# Patient Record
Sex: Male | Born: 1995 | Race: Black or African American | Hispanic: No | Marital: Single | Smoking: Never smoker
Health system: Southern US, Community
[De-identification: ages and names within clinical notes are randomized; demographics above are authoritative.]

## PROBLEM LIST (undated history)

## (undated) HISTORY — PX: KNEE SURGERY: SHX244

---

## 2015-05-18 DIAGNOSIS — J069 Acute upper respiratory infection, unspecified: Secondary | ICD-10-CM | POA: Diagnosis not present

## 2015-08-07 ENCOUNTER — Other Ambulatory Visit: Payer: Self-pay | Admitting: Family Medicine

## 2015-08-07 DIAGNOSIS — T1490XA Injury, unspecified, initial encounter: Secondary | ICD-10-CM

## 2015-08-07 DIAGNOSIS — M25562 Pain in left knee: Secondary | ICD-10-CM

## 2015-08-10 ENCOUNTER — Ambulatory Visit: Payer: Managed Care, Other (non HMO)

## 2015-08-11 ENCOUNTER — Ambulatory Visit
Admission: RE | Admit: 2015-08-11 | Discharge: 2015-08-11 | Disposition: A | Discharge status: Home / Self Care | Payer: Managed Care, Other (non HMO) | Source: Ambulatory Visit | Attending: Family Medicine | Admitting: Family Medicine

## 2015-08-11 ENCOUNTER — Encounter: Payer: Self-pay | Admitting: Emergency Medicine

## 2015-08-11 DIAGNOSIS — Z5321 Procedure and treatment not carried out due to patient leaving prior to being seen by health care provider: Secondary | ICD-10-CM | POA: Insufficient documentation

## 2015-08-11 DIAGNOSIS — X58XXXA Exposure to other specified factors, initial encounter: Secondary | ICD-10-CM

## 2015-08-11 DIAGNOSIS — M545 Low back pain: Secondary | ICD-10-CM | POA: Diagnosis present

## 2015-08-11 DIAGNOSIS — M25562 Pain in left knee: Secondary | ICD-10-CM

## 2015-08-11 DIAGNOSIS — M25462 Effusion, left knee: Secondary | ICD-10-CM

## 2015-08-11 DIAGNOSIS — S83242A Other tear of medial meniscus, current injury, left knee, initial encounter: Secondary | ICD-10-CM | POA: Insufficient documentation

## 2015-08-11 NOTE — ED Notes (Signed)
Patient ambulatory to triage with steady gait, without difficulty or distress noted; pt reports lower back pain with hx of same; st began increasing after bball season was completed

## 2015-08-12 ENCOUNTER — Emergency Department
Admission: EM | Admit: 2015-08-12 | Discharge: 2015-08-12 | Disposition: A | Discharge status: Home / Self Care | Payer: Managed Care, Other (non HMO) | Attending: Emergency Medicine | Admitting: Emergency Medicine

## 2015-08-12 NOTE — ED Notes (Signed)
Pt not found in flex lobby upon rounding

## 2015-08-28 ENCOUNTER — Ambulatory Visit: Payer: Managed Care, Other (non HMO)

## 2015-09-17 ENCOUNTER — Ambulatory Visit (INDEPENDENT_AMBULATORY_CARE_PROVIDER_SITE_OTHER): Payer: Managed Care, Other (non HMO) | Admitting: Family Medicine

## 2015-09-17 ENCOUNTER — Encounter: Payer: Self-pay | Admitting: Family Medicine

## 2015-09-17 VITALS — BP 122/67 | HR 94 | Temp 97.8°F | Resp 16

## 2015-09-17 DIAGNOSIS — J029 Acute pharyngitis, unspecified: Secondary | ICD-10-CM

## 2015-09-17 MED ORDER — AZITHROMYCIN 250 MG PO TABS: ORAL_TABLET | ORAL | Status: AC

## 2015-09-17 NOTE — Progress Notes (Signed)
Patient ID: Nicholas Duarte, male   DOB: 12/26/1995, 20 y.o.   MRN: [id number]  Patient presents today with symptoms of sore throat, nasal congestion, cough. Patient has had the symptoms for the last few days. He denies any fever or chills. He denies any chest pain, shortness of breath, nausea, vomiting, diarrhea, extreme fatigue, abdominal pain. He has not taken any medications today. He does state that the mucus that he is producing is colored.  ROS: Negative except mentioned above. Vitals as per Epic. GENERAL: NAD HEENT: mild pharyngeal erythema, no exudate, no erythema of TMs, no cervical LAD RESP: CTA B CARD: RRR NEURO: CN II-XII grossly intact   A/P: URI- rapid strep and flu screen were negative, patient given prescription for Z-Pak, Delsym when necessary, Claritin when necessary, Ibuprofen when necessary, rest, hydration, seek medical attention if symptoms persist or worsen. No athletic activity if febrile.

## 2016-04-07 ENCOUNTER — Ambulatory Visit (INDEPENDENT_AMBULATORY_CARE_PROVIDER_SITE_OTHER): Payer: 59 | Admitting: Family Medicine

## 2016-04-07 ENCOUNTER — Other Ambulatory Visit: Payer: Self-pay | Admitting: Family Medicine

## 2016-04-07 ENCOUNTER — Ambulatory Visit
Admission: RE | Admit: 2016-04-07 | Discharge: 2016-04-07 | Disposition: A | Discharge status: Home / Self Care | Payer: 59 | Source: Ambulatory Visit | Attending: Family Medicine | Admitting: Family Medicine

## 2016-04-07 VITALS — BP 116/62 | HR 71 | Temp 97.1°F

## 2016-04-07 DIAGNOSIS — G4489 Other headache syndrome: Secondary | ICD-10-CM

## 2016-04-07 DIAGNOSIS — S060X0D Concussion without loss of consciousness, subsequent encounter: Secondary | ICD-10-CM

## 2016-04-07 MED ORDER — NAPROXEN 500 MG PO TABS: 500.0000 mg | ORAL_TABLET | Freq: Two times a day (BID) | ORAL | 0 refills | Status: DC
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
## 2016-06-10 ENCOUNTER — Ambulatory Visit (INDEPENDENT_AMBULATORY_CARE_PROVIDER_SITE_OTHER): Payer: Managed Care, Other (non HMO) | Admitting: Family Medicine

## 2016-06-10 VITALS — BP 132/84 | HR 63

## 2016-06-10 DIAGNOSIS — Z020 Encounter for examination for admission to educational institution: Secondary | ICD-10-CM

## 2016-06-10 NOTE — Progress Notes (Signed)
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 He denies any acute problems at this time. Form was reviewed and filled out. We'll make copy of form and scan into the system. Seek medical attention if any medical problems.

## 2016-08-16 ENCOUNTER — Ambulatory Visit (INDEPENDENT_AMBULATORY_CARE_PROVIDER_SITE_OTHER): Payer: 59 | Admitting: Family Medicine

## 2016-08-16 ENCOUNTER — Encounter: Payer: Self-pay | Admitting: Family Medicine

## 2016-08-16 VITALS — BP 127/67 | HR 72 | Temp 98.4°F | Resp 14

## 2016-08-16 DIAGNOSIS — J029 Acute pharyngitis, unspecified: Secondary | ICD-10-CM

## 2016-08-16 LAB — POCT RAPID STREP A (OFFICE): Rapid Strep A Screen: NEGATIVE

## 2016-08-16 NOTE — Progress Notes (Signed)
Patient presents today with symptoms of sore throat. Patient states that he's had the symptoms for the last 2 days. He does have some postnasal drip as well. He does have history of seasonal allergies. He has been using his nasal spray. He denies taking any oral antihistamine. He admits that he may have had night sweats last night however did not check his temperature. He did take fever lowering medication prior to coming today. He denies any chest pain, shortness of breath, abdominal pain, severe headache. He denies any excessive fatigue.  ROS: Negative except mentioned above. Vitals as per Epic. GENERAL: NAD HEENT: mild pharyngeal erythema, no exudate, no erythema of TMs, no cervical LAD RESP: CTA B CARD: RRR ABD: +BS, NT, no organomegaly appreciated NEURO: CN II-XII grossly intact   A/P: Pharyngitis - rapid strep test was negative, throat culture sent, rest, hydration, Tylenol/Ibuprofen when necessary, if symptoms persist or worsen will do further workup such as Monospot test. Given patient's likely fever today would recommend no physical activity until afebrile without medication for 24 hours.

## 2016-08-18 LAB — CULTURE, GROUP A STREP: STREP A CULTURE: NEGATIVE

## 2016-08-24 ENCOUNTER — Emergency Department
Admission: EM | Admit: 2016-08-24 | Discharge: 2016-08-24 | Disposition: A | Discharge status: Home / Self Care | Payer: 59 | Attending: Emergency Medicine | Admitting: Emergency Medicine

## 2016-08-24 ENCOUNTER — Encounter: Payer: Self-pay | Admitting: Emergency Medicine

## 2016-08-24 DIAGNOSIS — J4520 Mild intermittent asthma, uncomplicated: Secondary | ICD-10-CM

## 2016-08-24 DIAGNOSIS — R0602 Shortness of breath: Secondary | ICD-10-CM | POA: Diagnosis present

## 2016-08-24 MED ORDER — ALBUTEROL SULFATE (2.5 MG/3ML) 0.083% IN NEBU
2.5000 mg | INHALATION_SOLUTION | Freq: Once | RESPIRATORY_TRACT | Status: AC
  Administered 2016-08-24: 2.5 mg via RESPIRATORY_TRACT
  Filled 2016-08-24: qty 3

## 2016-08-24 MED ORDER — ALBUTEROL SULFATE HFA 108 (90 BASE) MCG/ACT IN AERS: 2.0000 | INHALATION_SPRAY | RESPIRATORY_TRACT | 1 refills | Status: DC | PRN

## 2016-08-24 NOTE — ED Notes (Signed)
Pt. States he was outside walking around when his chest feeling tight. He has a history of asthma.  Came here for breathing treatment

## 2016-08-24 NOTE — ED Provider Notes (Signed)
[facility] Emergency Department Provider Note  ____________________________________________  Time seen: Approximately 9:56 PM  I have reviewed the triage vital signs and the nursing notes.   HISTORY  Chief Complaint Asthma   HPI Nicholas Duarte is a 21 y.o. male who presents to the emergency department for a breathing treatment. He is out of his albuterol and felt that his chest got a little tight when walking around outside. He has had asthma since childhood and does not feel that this is different from previous episodes. He denies recent illness.    History reviewed. No pertinent past medical history.  There are no active problems to display for this patient.   Past Surgical History:  Procedure Laterality Date  . KNEE SURGERY      Prior to Admission medications   Medication Sig Start Date End Date Taking? Authorizing Provider  albuterol (PROVENTIL HFA;VENTOLIN HFA) 108 (90 Base) MCG/ACT inhaler Inhale 2 puffs into the lungs every 4 (four) hours as needed for wheezing or shortness of breath. 08/24/16   Chinita Pester, FNP  azithromycin (ZITHROMAX Z-PAK) 250 MG tablet Use as directed for 5 days as on box. Patient not taking: Reported on 08/16/2016 09/17/15   Jolene Provost, MD  naproxen (NAPROSYN) 500 MG tablet Take 1 tablet (500 mg total) by mouth 2 (two) times daily with a meal. Patient not taking: Reported on 08/16/2016 04/07/16   Jolene Provost, MD    Allergies Augmentin [amoxicillin-pot clavulanate]; Peanuts [peanut oil]; and Shellfish allergy  No family history on file.  Social History Social History  Substance Use Topics  . Smoking status: Never Smoker  . Smokeless tobacco: Never Used  . Alcohol use No    Review of Systems Constitutional: Negative for fever/chills ENT: Negative for sore throat. Cardiovascular: Denies chest pain. Respiratory: Positive for shortness of breath. negative for cough. Gastrointestinal: Negative for nausea,   no vomiting.  Negative for diarrhea.  Musculoskeletal: Negative for body aches Skin: Negative for rash. Neurological: Negative for headaches ____________________________________________   PHYSICAL EXAM:  VITAL SIGNS: ED Triage Vitals  Enc Vitals Group     BP 08/24/16 2117 140/73     Pulse Rate 08/24/16 2117 62     Resp 08/24/16 2117 20     Temp 08/24/16 2117 97.7 F (36.5 C)     Temp Source 08/24/16 2117 Oral     SpO2 08/24/16 2117 97 %     Weight 08/24/16 2116 189 lb (85.7 kg)     Height 08/24/16 2116  (1.88 m)     Head Circumference --      Peak Flow --      Pain Score --      Pain Loc --      Pain Edu? --      Excl. in GC? --     Constitutional: Alert and oriented. Well appearing and in no acute distress. Eyes: Conjunctivae are normal. EOMI. Ears: Bilateral TM normal  Nose: No congestion noted; no rhinnorhea. Mouth/Throat: Mucous membranes are moist.  Oropharynx normal. Tonsils without exudate. Neck: No stridor.  Lymphatic: No cervical lymphadenopathy. Cardiovascular: Normal rate, regular rhythm. Good peripheral circulation. Respiratory: Normal respiratory effort.  No retractions. Diffuse expiratory wheezing. Gastrointestinal: Soft and nontender.  Musculoskeletal: FROM x 4 extremities.  Neurologic:  Normal speech and language.  Skin:  Skin is warm, dry and intact. No rash noted. Psychiatric: Mood and affect are normal. Speech and behavior are normal.  ____________________________________________   LABS (all labs ordered are  listed, but only abnormal results are displayed)  Labs Reviewed - No data to display ____________________________________________  EKG  Not indicated. ____________________________________________  RADIOLOGY  Not indicated. ____________________________________________   PROCEDURES  Procedure(s) performed: None  Critical Care performed: No ____________________________________________   INITIAL IMPRESSION / ASSESSMENT AND  PLAN / ED COURSE  21 year old male presenting to the emergency department for albuterol treatment. After one treatment, diffuse wheezes resolved. He'll be given a prescription for an albuterol inhaler and advised to follow up with the student health clinic or primary care provider if his choice for symptoms that return frequently. He was encouraged to return to the emergency department for symptoms that change or worsen if he is unable schedule an appointment.  Pertinent labs & imaging results that were available during my care of the patient were reviewed by me and considered in my medical decision making (see chart for details).  Discharge Medication List as of 08/24/2016 10:38 PM    START taking these medications   Details  albuterol (PROVENTIL HFA;VENTOLIN HFA) 108 (90 Base) MCG/ACT inhaler Inhale 2 puffs into the lungs every 4 (four) hours as needed for wheezing or shortness of breath., Starting Wed 08/24/2016, Print        If controlled substance prescribed during this visit, 12 month history viewed on the [facility] prior to issuing an initial prescription for Schedule II or III opiod. ____________________________________________   FINAL CLINICAL IMPRESSION(S) / ED DIAGNOSES  Final diagnoses:  Mild intermittent asthma without complication    Note:  This document was prepared using Dragon voice recognition software and may include unintentional dictation errors.     Chinita Pester, FNP 08/28/16 1759    Phineas Semen, MD 08/29/16 [phone]

## 2016-08-24 NOTE — ED Triage Notes (Signed)
Patient ambulatory to triage with steady gait, without difficulty or distress noted; pt reports asthma flare-up today; denies any recent illness but currently having difficulty with allergies; denies any cough

## 2017-02-20 ENCOUNTER — Encounter: Payer: Self-pay | Admitting: Family Medicine

## 2017-02-20 ENCOUNTER — Ambulatory Visit (INDEPENDENT_AMBULATORY_CARE_PROVIDER_SITE_OTHER): Payer: 59 | Admitting: Family Medicine

## 2017-02-20 VITALS — BP 109/64 | HR 71 | Temp 97.7°F | Resp 16

## 2017-02-20 DIAGNOSIS — J45909 Unspecified asthma, uncomplicated: Secondary | ICD-10-CM

## 2017-02-20 MED ORDER — ALBUTEROL SULFATE HFA 108 (90 BASE) MCG/ACT IN AERS: 2.0000 | INHALATION_SPRAY | RESPIRATORY_TRACT | 3 refills | Status: DC | PRN

## 2017-02-21 NOTE — Progress Notes (Signed)
Patient presents today with symptoms of postnasal drip, scratchy throat for a few days and mild coughing and tightness in his chest today. Patient states that the tightness in his chest started this morning while at practice. He states that he is out of his albuterol inhaler. Patient does have asthma. He only uses his rescue inhaler. He has been on Advair in the past but was taken off due to his controlled asthma symptoms. He does not take any allergy medications. He denies any other chest pain or palpitations. He denies any fever. He has not taken any medications this morning. He denies any calf swelling or calf pain.  ROS: Negative except mentioned above.  GENERAL: NAD HEENT: mild pharyngeal erythema, no exudate, no erythema of TMs, no cervical LAD RESP: CTA B, no accessory muscle use or wheezing appreciated CARD: RRR  MSK: no calf tenderness or swelling NEURO: CN II-XII grossly intact   A/P: URI, Asthma Exacerbation - likely viral etiology, supportive care, no athletic activity afebrile, recommend taking Claritin or Zyrtec for any postnasal drip, Albuterol Inhaler refilled, if symptoms do persist or worsen may want to consider doing further workup with CXR and starting an inhaled steroid. Encourage patient to have Albuterol Inhaler with him at all times. Seek medical attention if symptoms persist or worsen as discussed.  Above visit was done by [facility] Sports Medicine Fellow, Maximino Greenland.

## 2017-03-02 ENCOUNTER — Other Ambulatory Visit: Payer: Self-pay | Admitting: Family Medicine

## 2017-03-02 MED ORDER — ALBUTEROL SULFATE HFA 108 (90 BASE) MCG/ACT IN AERS: 2.0000 | INHALATION_SPRAY | RESPIRATORY_TRACT | 3 refills | Status: AC | PRN

## 2017-03-03 IMAGING — MR MR KNEE*L* W/O CM
6 series · 38 of 40 positions shown · non-contrast
Comparison: None.

CLINICAL DATA: Basketball injury. Left lateral knee swelling for 1
month.

EXAM:
MRI OF THE LEFT KNEE WITHOUT CONTRAST
TECHNIQUE: Multiplanar, multisequence MR imaging of the knee was performed. No
intravenous contrast was administered.

[Series 3: PD fat-sat · axial · 3.0mm · 0.29mm/px · z∈[-56,+56]mm · 7 of 35 slices shown (1 of 3)]
[im 1/35]
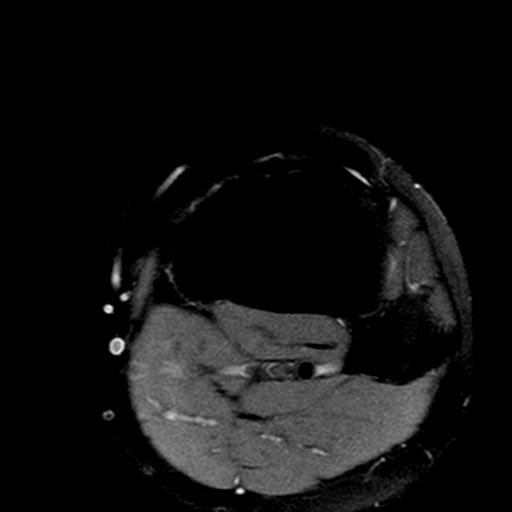
[im 6/35]
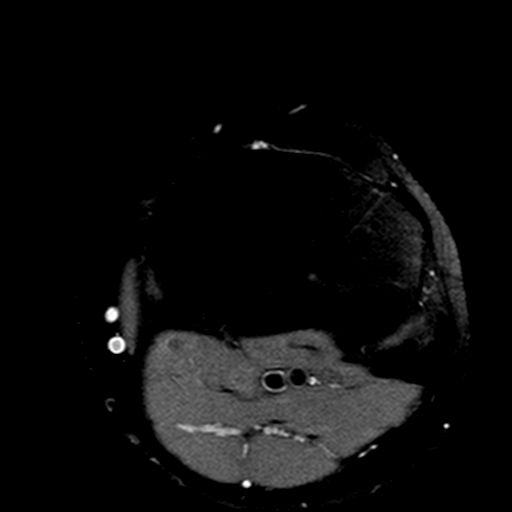
[im 12/35]
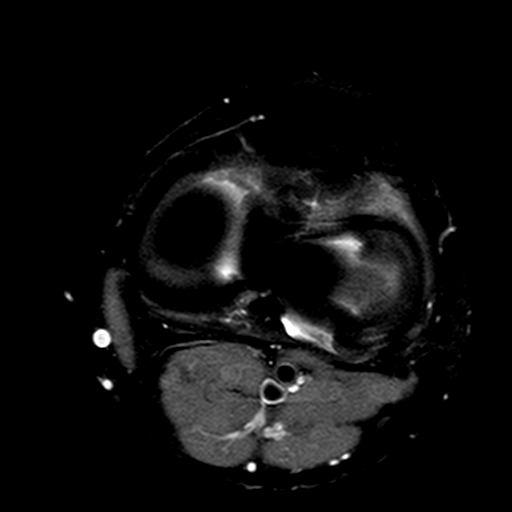
[im 18/35]
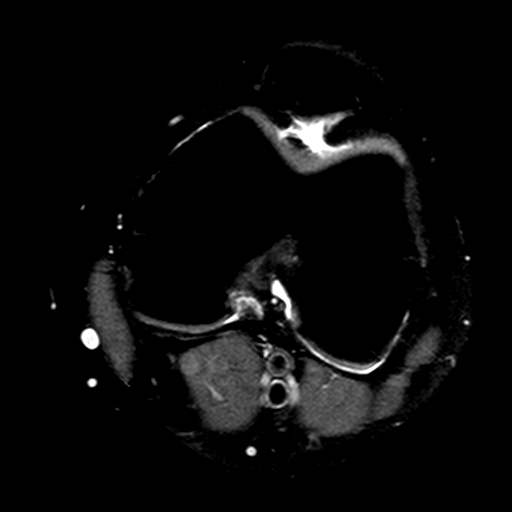
[im 23/35]
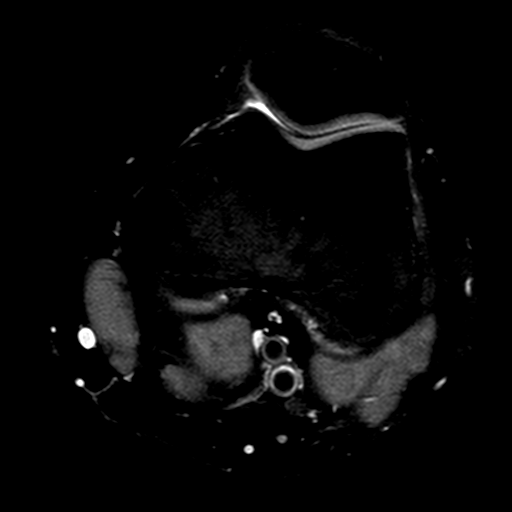
[im 29/35]
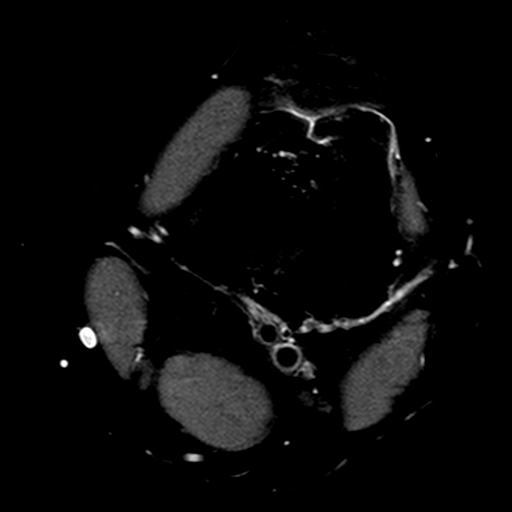
[im 35/35]
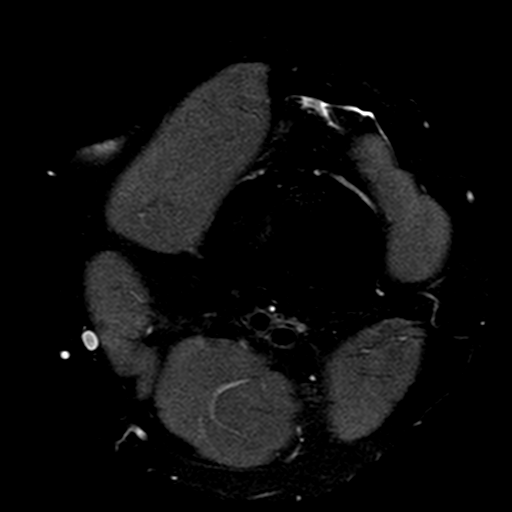

[Series 4: T2 fat-sat · coronal · 3.0mm · 0.62mm/px · 7 of 35 slices shown]
[im 1/35]
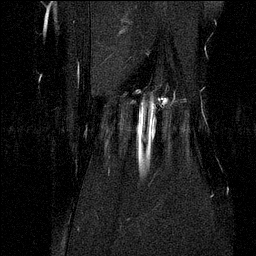
[im 6/35]
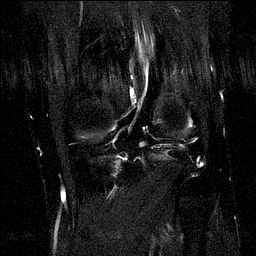
[im 12/35]
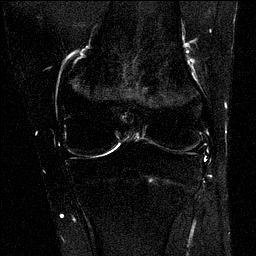
[im 18/35]
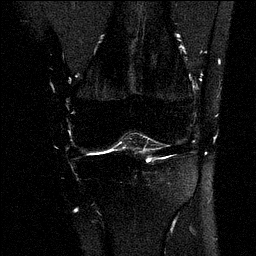
[im 23/35]
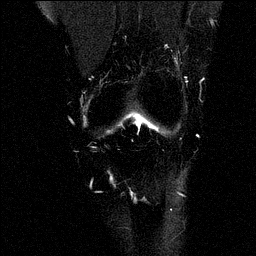
[im 29/35]
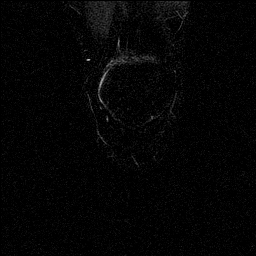
[im 35/35]
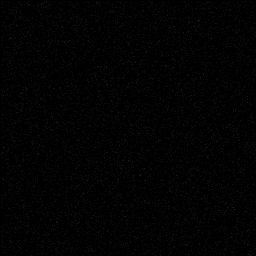

[Series 5: PD fat-sat · coronal · 3.0mm · 0.62mm/px · 7 of 35 slices shown (2 of 3)]
[im 1/35]
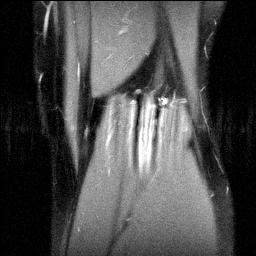
[im 6/35]
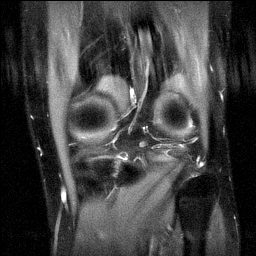
[im 12/35]
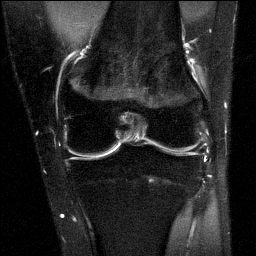
[im 18/35]
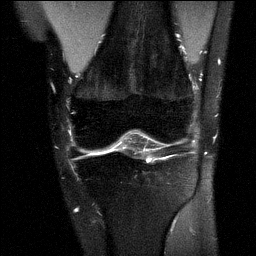
[im 23/35]
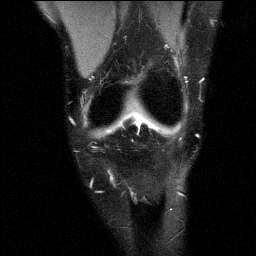
[im 29/35]
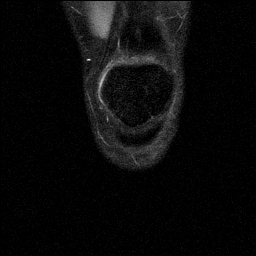
[im 35/35]
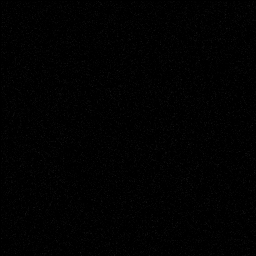

[Series 6: PD fat-sat · sagittal · 3.0mm · 0.62mm/px · 8 of 37 slices shown (3 of 3)]
[im 1/37]
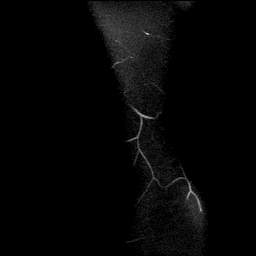
[im 6/37]
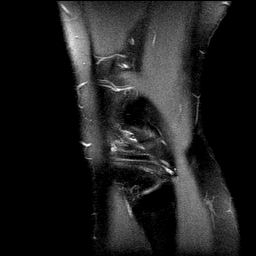
[im 11/37]
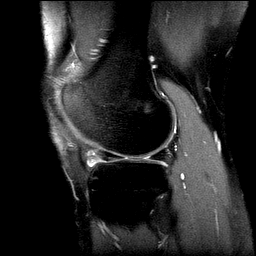
[im 16/37]
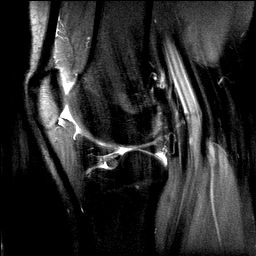
[im 21/37]
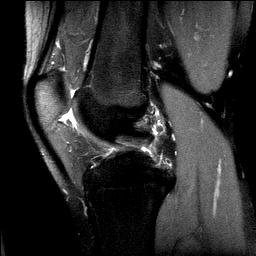
[im 26/37]
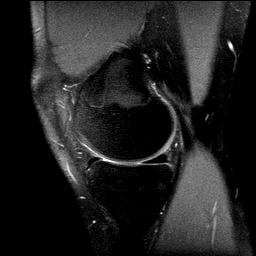
[im 31/37]
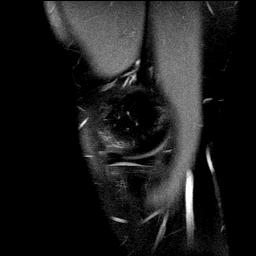
[im 37/37]
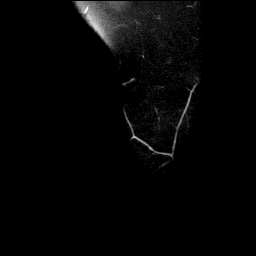

[Series 7: T1 · coronal · 3.0mm · 0.62mm/px · 5 of 35 slices shown]
[im 1/35]
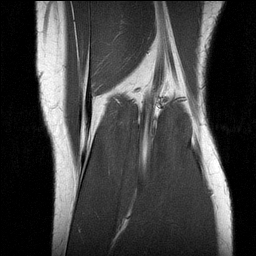
[im 6/35]
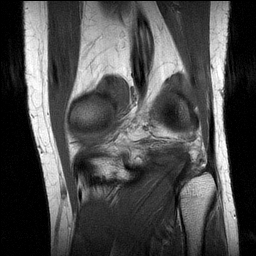
[im 12/35]
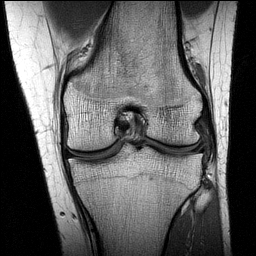
[im 18/35]
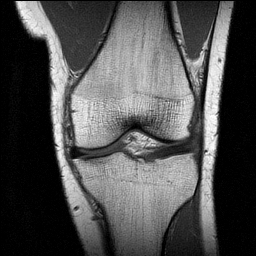
[im 23/35]
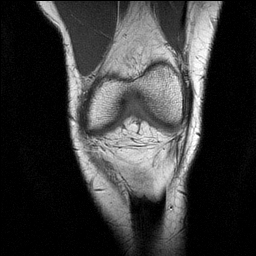

[Series 8: PD · oblique · 3.0mm · 0.31mm/px · 4 of 17 slices shown]
[im 1/17]
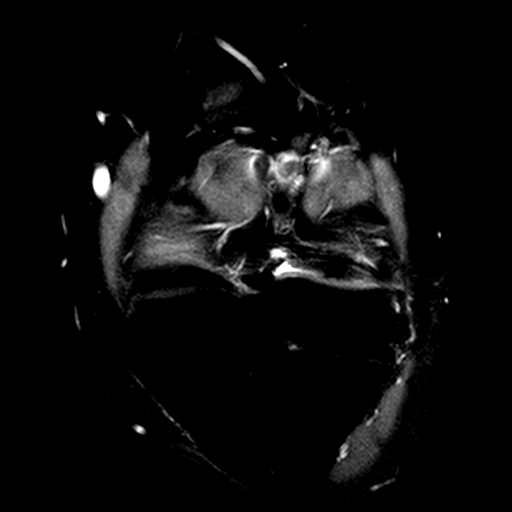
[im 6/17]
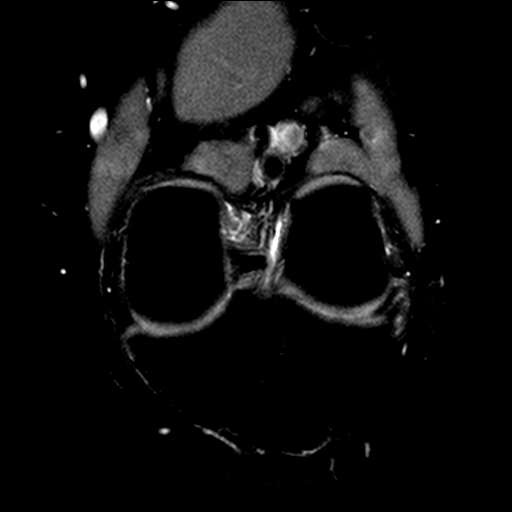
[im 11/17]
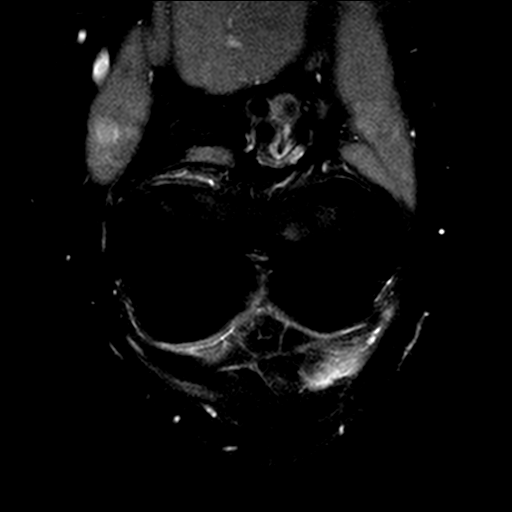
[im 17/17]
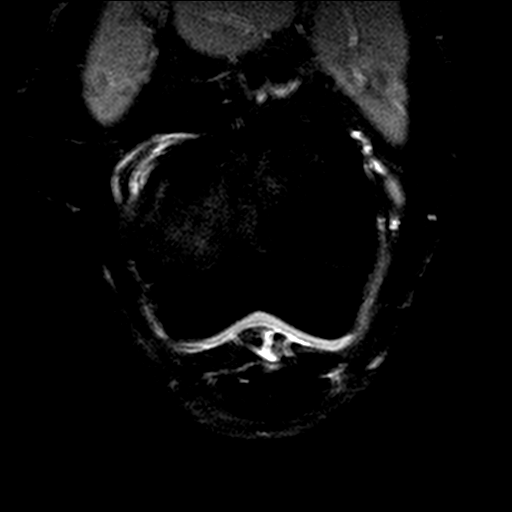

[38 of 40 positions shown; findings below may reference images not displayed]

FINDINGS: MENISCI

Medial meniscus:  Unremarkable

Lateral meniscus: Grade 3 oblique tear involving the posterior horn,
midbody, and adjacent anterior horn, extending from the inferior
surface to the meniscal periphery. Possible small meniscal flap
along the inferior periphery, image [DATE] and image [DATE].

LIGAMENTS

Cruciates:  Unremarkable

Collaterals:  Unremarkable

CARTILAGE

Patellofemoral:  Unremarkable

Medial:  Unremarkable

Lateral:  Unremarkable

Joint:  Small knee effusion.

Popliteal Fossa:  Unremarkable

Extensor Mechanism:  Unremarkable

Bones: No significant extra-articular osseous abnormalities
identified.
IMPRESSION: 1. Grade 3 oblique tear of the anterior horn, midbody, and posterior
horn lateral meniscus, with inferior surface extension and some
radial truncation of the free edge of the anterior horn, and
potentially with a small flap component extending posterior-inferior
from the posterior horn.
2. Small knee effusion.

## 2017-03-14 ENCOUNTER — Ambulatory Visit (INDEPENDENT_AMBULATORY_CARE_PROVIDER_SITE_OTHER): Payer: 59 | Admitting: Family Medicine

## 2017-03-14 ENCOUNTER — Other Ambulatory Visit: Payer: Self-pay | Admitting: Family Medicine

## 2017-03-14 ENCOUNTER — Ambulatory Visit
Admission: RE | Admit: 2017-03-14 | Discharge: 2017-03-14 | Disposition: A | Discharge status: Home / Self Care | Payer: 59 | Source: Ambulatory Visit | Attending: Family Medicine | Admitting: Family Medicine

## 2017-03-14 ENCOUNTER — Encounter: Payer: Self-pay | Admitting: Family Medicine

## 2017-03-14 VITALS — BP 121/73 | HR 67 | Temp 97.5°F | Resp 14

## 2017-03-14 DIAGNOSIS — G4489 Other headache syndrome: Secondary | ICD-10-CM

## 2017-03-14 DIAGNOSIS — S060X0A Concussion without loss of consciousness, initial encounter: Secondary | ICD-10-CM

## 2017-03-14 MED ORDER — ONDANSETRON 4 MG PO TBDP: 4.0000 mg | ORAL_TABLET | Freq: Three times a day (TID) | ORAL | 0 refills | Status: AC | PRN
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
## 2017-03-20 ENCOUNTER — Ambulatory Visit (INDEPENDENT_AMBULATORY_CARE_PROVIDER_SITE_OTHER): Payer: 59 | Admitting: Family Medicine

## 2017-03-20 ENCOUNTER — Encounter: Payer: Self-pay | Admitting: Family Medicine

## 2017-03-20 VITALS — BP 125/73 | HR 58 | Temp 97.7°F | Resp 14

## 2017-03-20 DIAGNOSIS — S060X0D Concussion without loss of consciousness, subsequent encounter: Secondary | ICD-10-CM | POA: Diagnosis not present

## 2017-03-20 MED ORDER — NAPROXEN 500 MG PO TABS: 500.0000 mg | ORAL_TABLET | Freq: Two times a day (BID) | ORAL | 0 refills | Status: AC

## 2017-03-20 NOTE — Progress Notes (Addendum)
Patient presents today for follow-up regarding his recent concussion. This is his fifth lifetime concussion. Patient states that his generalized headaches are still persistent. He denies waking up at night from a headache. He does notice that he has had some ringing of his ears as well. He admits that his nausea has improved. He has been using the Zofran at times. He believes that his appetite has improved some with the use of the Zofran and decreased nausea. He he has been able to do some classwork/homework intermittently at home. He still does feel fatigued at times. He has been taking daytime naps. He admits that he should be using his phone less than he has been doing. Bright lights do continue to bother him at times. He has taken Tylenol and Ibuprofen for his headaches but not consistently. He denies doing any athletic activity. A CT of the head was ordered from his initial visit due to his headaches getting worse. The CT was normal.  ROS: Negative except mentioned above.  GENERAL: NAD HEENT: no pharyngeal erythema, no exudate, no erythema of TMs, no cervical LAD RESP: CTA B CARD: RRR NEURO: CN II-XII grossly intact, PERRL, EOMI, -Rombergs, no nystagmus appreciated   A/P: Concussion - has improved minimally, encouraged avoiding things that cause his symptoms to increase, will try taking Naprosyn consistently with food for his headache, will [facility] week if no significant improvement for further evaluation and treatment. Discussed with patient that I would be happy to speak with his parents if he wanted. Follow-up with athletic trainer on a daily basis. Follow active rehab. Seek medical attention if any worsening of symptoms or changes in symptoms. Continue to work with Marketing executiveacademic advisor and professors on rescheduling assignment/tests. Patient will inform his athletic trainer and me if there are any issues with this.

## 2017-04-04 ENCOUNTER — Other Ambulatory Visit: Payer: Self-pay | Admitting: Family Medicine

## 2017-04-04 DIAGNOSIS — R51 Headache: Principal | ICD-10-CM

## 2017-04-04 DIAGNOSIS — R519 Headache, unspecified: Secondary | ICD-10-CM

## 2017-04-06 ENCOUNTER — Encounter: Payer: Self-pay | Admitting: Family Medicine

## 2017-04-06 ENCOUNTER — Ambulatory Visit (INDEPENDENT_AMBULATORY_CARE_PROVIDER_SITE_OTHER): Payer: 59 | Admitting: Family Medicine

## 2017-04-06 VITALS — BP 122/74 | HR 78 | Temp 98.9°F | Resp 14

## 2017-04-06 DIAGNOSIS — S060X0D Concussion without loss of consciousness, subsequent encounter: Secondary | ICD-10-CM

## 2017-04-06 MED ORDER — CYCLOBENZAPRINE HCL 5 MG PO TABS: 5.0000 mg | ORAL_TABLET | Freq: Every day | ORAL | 0 refills | Status: AC

## 2017-04-06 NOTE — Progress Notes (Signed)
Patient presents today for follow-up regarding concussion. Patient continues to have postconcussive symptoms of headache and blurry vision at times. He states that his headaches are about a 6-7 out of 10 on a consistent basis. He denies any nausea, vomiting, fever. He states that his headaches are the worse when he is around people making noise. He has been able to read for short periods of time. He has taken walks but has not done any significant athletic activity. He has tried Tylenol and Naproxen for his symptoms however has not had any significant relief with use of the medications.  Patient has seen [facility] Sab[facility]. He suggested that Max do PT for neck therapy and to also meet with neuro-ophthalmology. Patient states that he feels the PT has helped his headaches some after he does the therapy but then the headaches return. He has his second appointment today. He also has to get glasses that were suggested to him and start visual therapy. Patient is going to take incompletes on his classes so that he has until the end of J term to finish his work and get credit. His plan is to go home to [place] during the winter break.  ROS: Negative except mentioned above. Vitals as per Epic. GENERAL: NAD, makes good eye contact when talking RESP: CTA B CARD: RRR MSK: No cervical midline tenderness noted, minimal paravertebral tenderness bilaterally, FROM, negative Spurling's NEURO: CN II-XII grossly intact, no nystagmus appreciated  A/P: Postconcussive symptoms - continue PT 2-3 times a week, continue recommendations by neuro-ophthalmology regarding glasses and visual therapy, will prescribe patient Flexeril to use at nighttime as needed, will also have patient see Neurology to see if there are any other recommendations regarding medication, etc. I have spoken to Max's academic advisor and his father. If there is any need for me to write a letter to his professors I have said that I would  do so. The academic advisor will contact me if they need this. Patient can continue to do work at home and short increments as tolerated. Patient will talk to PT about doing therapy in [place] over winter break with a PT or will he be given home therapy instructions. He will also need to discuss how this will happen with visual therapy once he has gone to his first appointment.  Seek medical attention if any changes or worsening in symptoms.

## 2017-05-25 ENCOUNTER — Encounter: Payer: Self-pay | Admitting: Family Medicine

## 2017-05-25 ENCOUNTER — Ambulatory Visit (INDEPENDENT_AMBULATORY_CARE_PROVIDER_SITE_OTHER): Payer: 59 | Admitting: Family Medicine

## 2017-05-25 VITALS — BP 135/87 | HR 64 | Temp 98.6°F | Resp 14

## 2017-05-25 DIAGNOSIS — S060X0A Concussion without loss of consciousness, initial encounter: Secondary | ICD-10-CM

## 2017-06-01 NOTE — Progress Notes (Signed)
Patient presents today for follow-up regarding his concussion. Patient states that he feels back to normal now. He started to feel this way over winter break. He admits that there is one class that he needs to catch up with but otherwise he is fine regarding school. He has not done any strenuous physical activity. He is currently not taking any medications. He states his sleep and appetite are normal.    ROS: Negative except mentioned above. Vitals as per Epic GENERAL: NAD HEENT: no pharyngeal erythema, no exudate RESP: CTA B CARD: RRR NEURO: CN II-XII grossly intact, normal affect  A/P: Concussion - patient appears to be asymptomatic, IMPACT results were reviewed with patient, would recommend that patient start to do noncontact physical activity through protocol, would recommend doing IMPACT again in 1 month's time, avoid contact physical activity at least until then. Patient states that he is going to speak with his parents about basketball. 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 He of course wants to stay on the team but is unsure about returning to the court. Patient has had concussions in the past and his most recent concussion took about 2 months to resolve all his symptoms. Patient addresses understanding that there is no way to tell the possible future effects from his previous concussions on his cognitive function and/or mental health. Patient addresses understanding of this and will make a decision and inform the athletic trainer in the coming days. I've encouraged the patient to follow up with me at any time if he has any problems or questions.

## 2017-06-29 ENCOUNTER — Ambulatory Visit (INDEPENDENT_AMBULATORY_CARE_PROVIDER_SITE_OTHER): Payer: 59 | Admitting: Family Medicine

## 2017-06-29 ENCOUNTER — Encounter: Payer: Self-pay | Admitting: Family Medicine

## 2017-06-29 VITALS — BP 133/76 | HR 59 | Temp 98.1°F | Resp 14

## 2017-06-29 DIAGNOSIS — J069 Acute upper respiratory infection, unspecified: Secondary | ICD-10-CM

## 2017-06-29 NOTE — Progress Notes (Signed)
Patient presents today with symptoms of nasal drainage, cough, minimal sore throat. Patient states that he has had the symptoms for the last 3-4 days. He denies any fever or chills. He denies any chest pain or shortness of breath. Patient does have history of asthma and has been using his inhaler as needed. He has not taken any medications today. He denies any mucus that is colored.  ROS: Negative except mentioned above. Vitals as per Epic. GENERAL: NAD HEENT: mild pharyngeal erythema, no exudate, no erythema of TMs, no cervical LAD RESP: CTA B CARD: RRR NEURO: CN II-XII grossly intact   A/P: URI - appears to be viral, treat symptomatically with over-the-counter medication, different medications discussed with the patient, if symptoms do persist or worsen seek medical attention as discussed. No athletic activity or class if febrile.  S/P Concussion - patient denies any problems at this time. Admits that he is doing fine in class and socially. Denies any ongoing/chronic symptoms. Will be taking IMPACT next week for interval change. Patient plans to follow-up with [facility]. He has had no problems doing noncontact activity. Can continue with noncontact activity for now. Seek medical attention if any problems arise. Patient has not made any definitive decisions regarding returning back to basketball. Will likely make this decision after the season ends and has had a follow-up appointment with Dr. Mellody Dancearnero.

## 2017-09-08 ENCOUNTER — Encounter: Payer: Self-pay | Admitting: Family Medicine

## 2017-09-08 ENCOUNTER — Ambulatory Visit (INDEPENDENT_AMBULATORY_CARE_PROVIDER_SITE_OTHER): Payer: 59 | Admitting: Family Medicine

## 2017-09-08 VITALS — BP 140/77 | HR 64 | Temp 98.6°F | Resp 14

## 2017-09-08 DIAGNOSIS — F0781 Postconcussional syndrome: Secondary | ICD-10-CM

## 2017-09-08 DIAGNOSIS — R413 Other amnesia: Secondary | ICD-10-CM

## 2017-09-14 ENCOUNTER — Other Ambulatory Visit: Payer: Self-pay | Admitting: Family Medicine

## 2017-09-14 DIAGNOSIS — F0781 Postconcussional syndrome: Secondary | ICD-10-CM

## 2017-09-19 ENCOUNTER — Ambulatory Visit: Payer: 59 | Admitting: Family Medicine

## 2017-09-25 ENCOUNTER — Other Ambulatory Visit: Payer: Self-pay | Admitting: Neurology

## 2017-09-25 DIAGNOSIS — S060X0S Concussion without loss of consciousness, sequela: Secondary | ICD-10-CM

## 2017-10-03 ENCOUNTER — Ambulatory Visit: Payer: 59

## 2018-02-16 ENCOUNTER — Encounter: Payer: Self-pay | Admitting: Family Medicine

## 2018-02-16 ENCOUNTER — Ambulatory Visit (INDEPENDENT_AMBULATORY_CARE_PROVIDER_SITE_OTHER): Payer: 59 | Admitting: Family Medicine

## 2018-02-16 VITALS — BP 126/75 | HR 66 | Temp 98.0°F | Resp 14

## 2018-02-16 DIAGNOSIS — R413 Other amnesia: Secondary | ICD-10-CM

## 2018-02-16 DIAGNOSIS — Z87820 Personal history of traumatic brain injury: Secondary | ICD-10-CM

## 2018-03-08 NOTE — Progress Notes (Signed)
Patient presents today for follow-up regarding previous concussion.  Patient has not been seen since May 2019.  Patient admits that he had a good summer in [place].  He comes in today to inform me that he has seen Dr. Sabino Gasser at [facility] and has started visual therapy again.  He admits that he is still having issues related to short-term memory however it is not as pronounced as before the summer.  He denies any chronic headaches or dizziness.  He feels that he may need accommodations related to his classes given his problems.  He plans to speak to his academic advisor regarding this.  He admits that he was unable to do the head MRI and labs that were requested by Dr. Sherryll Burger prior to him leaving for summer break.  He also was unable to see a neuropsychologist when he was home for the summer.  He believes that he does have an appointment with a neuropsychologist at [facility] on December 2.  He is not sure about the person's name or specialty.  He states that he was referred to this person by Dr. Sabino Gasser.  He admits that he has not been doing any contact physical activity.  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  He denies any problems with appetite or sleep.  He denies any problems with mood such as depression or anxiety.  He denies any alcohol or illicit drug use.  ROS: Negative except mentioned above. Vitals as per Epic.  GENERAL: NAD, normal affect  HEENT: no pharyngeal erythema, no exudate RESP: CTA B CARD: RRR NEURO: CN II-XII grossly intact   A/P: Postconcussive symptoms, memory changes - I have recommended that patient follow-up with Dr. Sherryll Burger regarding the work-up he requested prior to the patient going home for the summer, keep appointment with neuropsychologist Jena Gauss says he has an appointment with, continue visual therapy at [facility] as suggested by Dr. Sabino Gasser, I would recommend that patient talk to his academic advisor about the struggles he is having with his specific  classes, would use Disability Services if Academic Advisor suggests, discussed with the patient that any of his providers including myself would be willing to help him with any medical documentation required.  I have also suggested counseling services if patient is having any struggles mentally with school or current symptoms.  Patient denies a need to go to counseling services at this current time but is aware services are available. Patient appreciative. Would recommend follow-up with me after work-up with Dr. Sherryll Burger has been completed, sooner if needed.

## 2018-03-08 NOTE — Progress Notes (Signed)
Patient presents today to discuss his postconcussive symptoms.  Patient admits that he is much better than at the beginning of the semester.  He denies any chronic headaches or dizziness.  He has however noticed some changes related to memory.  He feels that he has to write things down to remember.  He denies any problems with long-term memory but does feel that his short-term memory is different now.  He gives an example as forgetting things someone has just told him. He denies having any significant issues with his schoolwork though.  He denies any problems with sleep or appetite.  He continues to be out of contact physical activity.  He denies taking any medications on a regular basis.  He admits that his mood has been good.  He denies any depression symptoms or anxiety.  He denies any alcohol or illicit drug use.  He denies any history of attention issues in his childhood.  ROS: Negative except mentioned above. Vitals as per Epic. GENERAL: NAD, normal affect HEENT: no pharyngeal erythema, no exudate RESP: CTA B CARD: RRR NEURO: CN II-XII grossly intact, no nystagmus, negative Rombergs   A/P: Postconcussive symptoms, memory changes - would recommend that patient see Neurology (Dr. Sherryll Burger), I asked the patient to make a list of the things that he has noticed regarding his memory so he can give examples to Dr. Sherryll Burger, patient does plan on going home to [facility] over the summer, would still recommend that patient avoid any contact physical activity, patient address understanding of this, will seek medical attention over the summer in [place] if needed.

## 2018-05-29 ENCOUNTER — Ambulatory Visit (INDEPENDENT_AMBULATORY_CARE_PROVIDER_SITE_OTHER): Payer: 59 | Admitting: Family Medicine

## 2018-05-29 VITALS — BP 128/83 | HR 89 | Temp 97.5°F | Resp 14

## 2018-05-29 DIAGNOSIS — R509 Fever, unspecified: Secondary | ICD-10-CM

## 2018-05-29 LAB — POCT INFLUENZA A/B
Influenza A, POC: NEGATIVE
Influenza B, POC: NEGATIVE

## 2018-05-29 NOTE — Progress Notes (Signed)
Patient presents today with symptoms of fever, sore throat, headache, minimal cough since yesterday.  Patient did take fever lowering medication yesterday.  Patient has had sick contacts.  Patient denies getting a flu vaccination this year.  Patient denies any myalgias, chest pain, shortness of breath, nausea, vomiting, diarrhea, abdominal pain.  Patient has not taken any medications this morning.  ROS: Negative except mentioned above. Those as per Epic. GENERAL: NAD HEENT: minimal pharyngeal erythema, no exudate, no erythema of TMs, no cervical LAD RESP: CTA B CARD: RRR NEURO: CN II-XII grossly intact   A/P: Pharyngitis, Fever -likely viral, rapid strep test and influenza screen negative in the office, rest, hydration, Tylenol/Ibuprofen as needed, no athletic activity or class until afebrile for 24 hours without taking fever lowering medication, seek medical attention if symptoms persist or worsen as discussed.  Hx of postconcussive symptoms - Patient denies any current symptoms, patient was assessed for attention deficit issues at [facility] at the end of last year, patient states that he was told to try getting accommodations for certain classes if needed instead of being put on ADD medication, currently he has an A in his class for J term, I have told him to get the appropriate paperwork filled out by the physician or neuropsychologist that saw him at [facility] to get any further accommodations for his spring semester, patient addresses understanding of this and will do so, I did give him my fax number as well if needed, I have asked that he come and speak with me if he is having any trouble doing above.  Patient appreciative.

## 2018-06-19 ENCOUNTER — Ambulatory Visit (INDEPENDENT_AMBULATORY_CARE_PROVIDER_SITE_OTHER): Payer: 59 | Admitting: Family Medicine

## 2018-06-19 DIAGNOSIS — F0781 Postconcussional syndrome: Secondary | ICD-10-CM

## 2018-06-19 NOTE — Progress Notes (Signed)
Presents today to review recent paperwork that was sent to him from Neuropsych at [facility].  The paperwork states that the patient should have accommodations met for classwork/tests.  Patient states that he is taking 3 classes this semester.  He states that he did well during J term but only had one class.  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  He denies any problems in daily life activities.  ROS: Negative except mentioned above. Vitals as per Epic. GENERAL: NAD, normal affect NEURO: CN II-XII grossly intact   A/P: Postconcussive symptoms -reviewed the paperwork sent from Neuropsych.,  Recommendations currently do not state trying ADD medication.  I have encouraged the patient to follow-up with Ronny Flurry his academic advisor to discuss the process of submission of this paperwork so patient can get accommodations met for his classes.  I have asked that the patient fill out any paperwork required by disability services or the [facility] and to let me know if there is anything I need to do.  Patient addresses understanding of this and will follow up with me as needed.

## 2018-07-16 ENCOUNTER — Encounter: Payer: Self-pay | Admitting: Family Medicine

## 2018-07-16 ENCOUNTER — Ambulatory Visit (INDEPENDENT_AMBULATORY_CARE_PROVIDER_SITE_OTHER): Payer: Self-pay | Admitting: Family Medicine

## 2018-07-16 VITALS — BP 145/76 | HR 69 | Temp 98.9°F | Resp 14

## 2018-07-16 DIAGNOSIS — R509 Fever, unspecified: Secondary | ICD-10-CM

## 2018-07-16 NOTE — Progress Notes (Signed)
Patient presents today with symptoms of sore throat, fever, cough, fatigue.  Patient states that he has had symptoms for the last 3 to 4 days.  Patient did go to urgent care over the weekend and got tested for the flu and strep which both came back as negative according to the patient.  He was given Tamiflu to start which she did 2 days ago.  Patient did take Tylenol earlier today.  He denies any chest pain, shortness of breath, ear pain, nausea, vomiting, diarrhea, severe headache, neck stiffness, photophobia.  He denies any recent travel in the last few weeks.  He denies any sick contacts with the flu or mono recently.  ROS: Negative except mentioned above. Also as per Epic. GENERAL: NAD HEENT: mild pharyngeal erythema, no exudate, no erythema of TMs, no significant cervical LAD RESP: CTA B CARD: RRR ABD: Nontender, no organomegaly appreciated NEURO: CN II-XII grossly intact   A/P: URI - will do CBCw/diff and monospot test given patient's persistent symptoms, Tylenol/Ibuprofen as needed, rest, hydration, over-the-counter cough medication as needed, no athletic activity or class until afebrile for 24 hours without taking fever lowering medication, seek medical attention if symptoms persist or worsen as discussed.

## 2018-07-17 LAB — CBC WITH DIFFERENTIAL/PLATELET
BASOS ABS: 0 10*3/uL (ref 0.0–0.2)
Basos: 0 %
EOS (ABSOLUTE): 0.3 10*3/uL (ref 0.0–0.4)
EOS: 6 %
HEMOGLOBIN: 14.7 g/dL (ref 13.0–17.7)
Hematocrit: 45.5 % (ref 37.5–51.0)
IMMATURE GRANULOCYTES: 0 %
Immature Grans (Abs): 0 10*3/uL (ref 0.0–0.1)
LYMPHS ABS: 2.2 10*3/uL (ref 0.7–3.1)
Lymphs: 40 %
MCH: 27.8 pg (ref 26.6–33.0)
MCHC: 32.3 g/dL (ref 31.5–35.7)
MCV: 86 fL (ref 79–97)
MONOCYTES: 14 %
MONOS ABS: 0.8 10*3/uL (ref 0.1–0.9)
NEUTROS PCT: 40 %
Neutrophils Absolute: 2.3 10*3/uL (ref 1.4–7.0)
Platelets: 246 10*3/uL (ref 150–450)
RBC: 5.29 x10E6/uL (ref 4.14–5.80)
RDW: 11.7 % (ref 11.6–15.4)
WBC: 5.6 10*3/uL (ref 3.4–10.8)

## 2018-07-17 LAB — MONONUCLEOSIS SCREEN: Mono Screen: NEGATIVE
